# Patient Record
Sex: Male | Born: 1970 | Race: Black or African American | Hispanic: No | Marital: Married | Smoking: Light tobacco smoker
Health system: Southern US, Community
[De-identification: ages and names within clinical notes are randomized; demographics above are authoritative.]

## PROBLEM LIST (undated history)

## (undated) DIAGNOSIS — Z8709 Personal history of other diseases of the respiratory system: Secondary | ICD-10-CM

## (undated) DIAGNOSIS — Z973 Presence of spectacles and contact lenses: Secondary | ICD-10-CM

## (undated) DIAGNOSIS — I10 Essential (primary) hypertension: Secondary | ICD-10-CM

## (undated) DIAGNOSIS — E119 Type 2 diabetes mellitus without complications: Secondary | ICD-10-CM

## (undated) HISTORY — PX: NO PAST SURGERIES: SHX2092

---

## 1998-03-18 ENCOUNTER — Encounter: Admission: RE | Admit: 1998-03-18 | Discharge: 1998-03-18 | Payer: Self-pay | Admitting: *Deleted

## 1998-04-04 ENCOUNTER — Ambulatory Visit: Admission: RE | Admit: 1998-04-04 | Discharge: 1998-04-04 | Payer: Self-pay | Admitting: Internal Medicine

## 2016-06-08 ENCOUNTER — Ambulatory Visit: Payer: Self-pay | Admitting: General Surgery

## 2016-07-13 ENCOUNTER — Encounter (HOSPITAL_COMMUNITY): Payer: Self-pay

## 2016-07-13 ENCOUNTER — Encounter (HOSPITAL_COMMUNITY)
Admission: RE | Admit: 2016-07-13 | Discharge: 2016-07-13 | Disposition: A | Discharge status: Home / Self Care | Payer: BC Managed Care – PPO | Source: Ambulatory Visit | Attending: General Surgery | Admitting: General Surgery

## 2016-07-13 DIAGNOSIS — I10 Essential (primary) hypertension: Secondary | ICD-10-CM | POA: Diagnosis not present

## 2016-07-13 DIAGNOSIS — Z01810 Encounter for preprocedural cardiovascular examination: Secondary | ICD-10-CM | POA: Insufficient documentation

## 2016-07-13 DIAGNOSIS — K429 Umbilical hernia without obstruction or gangrene: Secondary | ICD-10-CM | POA: Insufficient documentation

## 2016-07-13 DIAGNOSIS — Z01818 Encounter for other preprocedural examination: Secondary | ICD-10-CM | POA: Diagnosis present

## 2016-07-13 DIAGNOSIS — Z01812 Encounter for preprocedural laboratory examination: Secondary | ICD-10-CM | POA: Insufficient documentation

## 2016-07-13 DIAGNOSIS — E119 Type 2 diabetes mellitus without complications: Secondary | ICD-10-CM | POA: Insufficient documentation

## 2016-07-13 DIAGNOSIS — Z8709 Personal history of other diseases of the respiratory system: Secondary | ICD-10-CM | POA: Insufficient documentation

## 2016-07-13 HISTORY — DX: Personal history of other diseases of the respiratory system: Z87.09

## 2016-07-13 HISTORY — DX: Essential (primary) hypertension: I10

## 2016-07-13 HISTORY — DX: Type 2 diabetes mellitus without complications: E11.9

## 2016-07-13 HISTORY — DX: Presence of spectacles and contact lenses: Z97.3

## 2016-07-13 LAB — BASIC METABOLIC PANEL
ANION GAP: 6 (ref 5–15)
BUN: 8 mg/dL (ref 6–20)
CALCIUM: 9.5 mg/dL (ref 8.9–10.3)
CO2: 26 mmol/L (ref 22–32)
CREATININE: 0.98 mg/dL (ref 0.61–1.24)
Chloride: 107 mmol/L (ref 101–111)
GFR calc Af Amer: >60 mL/min (ref >60)
GLUCOSE: 118 mg/dL — AB (ref 65–99)
Potassium: 3.7 mmol/L (ref 3.5–5.1)
Sodium: 139 mmol/L (ref 135–145)

## 2016-07-13 LAB — CBC WITH DIFFERENTIAL/PLATELET
BASOS ABS: 0 10*3/uL (ref 0.0–0.1)
BASOS PCT: 0 %
EOS ABS: 0.3 10*3/uL (ref 0.0–0.7)
EOS PCT: 3 %
HCT: 41.1 % (ref 39.0–52.0)
Hemoglobin: 14.5 g/dL (ref 13.0–17.0)
Lymphocytes Relative: 31 %
Lymphs Abs: 2.4 10*3/uL (ref 0.7–4.0)
MCH: 32.2 pg (ref 26.0–34.0)
MCHC: 35.3 g/dL (ref 30.0–36.0)
MCV: 91.1 fL (ref 78.0–100.0)
MONO ABS: 0.7 10*3/uL (ref 0.1–1.0)
MONOS PCT: 9 %
NEUTROS ABS: 4.5 10*3/uL (ref 1.7–7.7)
Neutrophils Relative %: 57 %
PLATELETS: 293 10*3/uL (ref 150–400)
RBC: 4.51 MIL/uL (ref 4.22–5.81)
RDW: 13.6 % (ref 11.5–15.5)
WBC: 7.8 10*3/uL (ref 4.0–10.5)

## 2016-07-13 LAB — GLUCOSE, CAPILLARY: GLUCOSE-CAPILLARY: 163 mg/dL — AB (ref 65–99)

## 2016-07-13 NOTE — Pre-Procedure Instructions (Signed)
David Strickland  07/13/2016     No Pharmacies Listed   Your procedure is scheduled on Tuesday, December 12th, 2017.  [facility] at 5:30 A.M.   Call this number if you have problems the morning of surgery:  [phone]   Remember:  Do not eat food or drink liquids after midnight.   Take these medicines the morning of surgery with A SIP OF WATER:   7 days prior to surgery, stop taking: Aspirin, NSAIDS, Aleve, Naproxen, Ibuprofen, Advil, Motrin, BC's, Goody's, Fish oil, all herbal medications, and all vitamins.    Do not wear jewelry.  Do not wear lotions, powders, or colognes, or deoderant.  Men may shave face and neck.  Do not bring valuables to the hospital.  [facility] any belongings or valuables.  Contacts, dentures or bridgework may not be worn into surgery.  Leave your suitcase in the car.  After surgery it may be brought to your room.  For patients admitted to the hospital, discharge time will be determined by your treatment team.  Patients discharged the day of surgery will not be allowed to drive home.   Special instructions:  Preparing for Surgery.   [place]- Preparing For Surgery  Before surgery, you can play an important role. Because skin is not sterile, your skin needs to be as free of germs as possible. You can reduce the number of germs on your skin by washing with CHG (chlorahexidine gluconate) Soap before surgery.  CHG is an antiseptic cleaner which kills germs and bonds with the skin to continue killing germs even after washing.  Please do not use if you have an allergy to CHG or antibacterial soaps. If your skin becomes reddened/irritated stop using the CHG.  Do not shave (including legs and underarms) for at least 48 hours prior to first CHG shower. It is OK to shave your face.  Please follow these instructions carefully.   1. Shower the NIGHT BEFORE SURGERY and the MORNING OF SURGERY with CHG.    2. If you chose to wash your hair, wash your hair first as usual with your normal shampoo.  3. After you shampoo, rinse your hair and body thoroughly to remove the shampoo.  4. Use CHG as you would any other liquid soap. You can apply CHG directly to the skin and wash gently with a scrungie or a clean washcloth.   5. Apply the CHG Soap to your body ONLY FROM THE NECK DOWN.  Do not use on open wounds or open sores. Avoid contact with your eyes, ears, mouth and genitals (private parts). Wash genitals (private parts) with your normal soap.  6. Wash thoroughly, paying special attention to the area where your surgery will be performed.  7. Thoroughly rinse your body with warm water from the neck down.  8. DO NOT shower/wash with your normal soap after using and rinsing off the CHG Soap.  9. Pat yourself dry with a CLEAN TOWEL.   10. Wear CLEAN PAJAMAS   11. Place CLEAN SHEETS on your bed the night of your first shower and DO NOT SLEEP WITH PETS.  Day of Surgery: Do not apply any deodorants/lotions. Please wear clean clothes to the hospital/surgery center.     Please read over the following fact sheets that you were given.

## 2016-07-13 NOTE — Progress Notes (Signed)
PCP - Dr. Knox RoyaltyEnrico Jones Cardiologist - denies  EKG - 07/13/16 CXR - 07/13/16  Echo/stress test/cardiac cath - denies  Patient denies chest pain and shortness of breath at PAT appointment.  Patient informed nurse that he does not check his blood sugar at home and that he recently had an A1C drawn at PCP.  Patient reports that it was 5.1.   Nurse requested A1C from PCP.

## 2016-07-15 NOTE — Progress Notes (Signed)
Spoke with David Strickland in MR at Dr.Erico Jones and states last A1C was in Feb 2017

## 2016-07-19 MED ORDER — ACETAMINOPHEN 500 MG PO TABS
1000.0000 mg | ORAL_TABLET | ORAL | Status: AC
  Administered 2016-07-20: 1000 mg via ORAL
  Filled 2016-07-19: qty 2

## 2016-07-19 MED ORDER — GABAPENTIN 300 MG PO CAPS
300.0000 mg | ORAL_CAPSULE | ORAL | Status: AC
  Administered 2016-07-20: 300 mg via ORAL
  Filled 2016-07-19: qty 1

## 2016-07-19 MED ORDER — CEFAZOLIN SODIUM-DEXTROSE 2-4 GM/100ML-% IV SOLN
2.0000 g | INTRAVENOUS | Status: AC
  Administered 2016-07-20: 2 g via INTRAVENOUS
  Filled 2016-07-19: qty 100

## 2016-07-20 ENCOUNTER — Encounter (HOSPITAL_COMMUNITY)
Admission: RE | Disposition: A | Discharge status: Home / Self Care | Payer: Self-pay | Source: Ambulatory Visit | Attending: General Surgery

## 2016-07-20 ENCOUNTER — Ambulatory Visit (HOSPITAL_COMMUNITY): Payer: BC Managed Care – PPO | Admitting: Anesthesiology

## 2016-07-20 ENCOUNTER — Encounter (HOSPITAL_COMMUNITY): Payer: Self-pay | Admitting: *Deleted

## 2016-07-20 ENCOUNTER — Ambulatory Visit (HOSPITAL_COMMUNITY)
Admission: RE | Admit: 2016-07-20 | Discharge: 2016-07-20 | Disposition: A | Discharge status: Home / Self Care | Payer: BC Managed Care – PPO | Source: Ambulatory Visit | Attending: General Surgery | Admitting: General Surgery

## 2016-07-20 DIAGNOSIS — I10 Essential (primary) hypertension: Secondary | ICD-10-CM | POA: Insufficient documentation

## 2016-07-20 DIAGNOSIS — E7800 Pure hypercholesterolemia, unspecified: Secondary | ICD-10-CM | POA: Diagnosis not present

## 2016-07-20 DIAGNOSIS — E119 Type 2 diabetes mellitus without complications: Secondary | ICD-10-CM | POA: Insufficient documentation

## 2016-07-20 DIAGNOSIS — K429 Umbilical hernia without obstruction or gangrene: Secondary | ICD-10-CM | POA: Diagnosis present

## 2016-07-20 DIAGNOSIS — Z6830 Body mass index (BMI) 30.0-30.9, adult: Secondary | ICD-10-CM | POA: Diagnosis not present

## 2016-07-20 DIAGNOSIS — F17200 Nicotine dependence, unspecified, uncomplicated: Secondary | ICD-10-CM | POA: Insufficient documentation

## 2016-07-20 DIAGNOSIS — K420 Umbilical hernia with obstruction, without gangrene: Secondary | ICD-10-CM | POA: Diagnosis not present

## 2016-07-20 DIAGNOSIS — E669 Obesity, unspecified: Secondary | ICD-10-CM | POA: Insufficient documentation

## 2016-07-20 DIAGNOSIS — Z79899 Other long term (current) drug therapy: Secondary | ICD-10-CM | POA: Diagnosis not present

## 2016-07-20 HISTORY — PX: UMBILICAL HERNIA REPAIR: SHX196

## 2016-07-20 HISTORY — PX: INSERTION OF MESH: SHX5868

## 2016-07-20 LAB — GLUCOSE, CAPILLARY
GLUCOSE-CAPILLARY: 91 mg/dL (ref 65–99)
GLUCOSE-CAPILLARY: 93 mg/dL (ref 65–99)

## 2016-07-20 SURGERY — REPAIR, HERNIA, UMBILICAL, LAPAROSCOPIC
Anesthesia: General

## 2016-07-20 MED ORDER — ONDANSETRON HCL 4 MG/2ML IJ SOLN
INTRAMUSCULAR | Status: DC | PRN
  Administered 2016-07-20: 4 mg via INTRAVENOUS

## 2016-07-20 MED ORDER — HYDROMORPHONE HCL 1 MG/ML IJ SOLN
0.2500 mg | INTRAMUSCULAR | Status: DC | PRN
  Administered 2016-07-20 (×3): 0.5 mg via INTRAVENOUS

## 2016-07-20 MED ORDER — LIDOCAINE 2% (20 MG/ML) 5 ML SYRINGE
INTRAMUSCULAR | Status: AC
  Filled 2016-07-20: qty 20

## 2016-07-20 MED ORDER — EPHEDRINE SULFATE 50 MG/ML IJ SOLN
INTRAMUSCULAR | Status: DC | PRN
  Administered 2016-07-20: 5 mg via INTRAVENOUS
  Administered 2016-07-20: 10 mg via INTRAVENOUS

## 2016-07-20 MED ORDER — CHLORHEXIDINE GLUCONATE CLOTH 2 % EX PADS: 6.0000 | MEDICATED_PAD | Freq: Once | CUTANEOUS | Status: DC

## 2016-07-20 MED ORDER — LIDOCAINE HCL (CARDIAC) 20 MG/ML IV SOLN
INTRAVENOUS | Status: DC | PRN
  Administered 2016-07-20: 60 mg via INTRAVENOUS

## 2016-07-20 MED ORDER — LACTATED RINGERS IV SOLN
INTRAVENOUS | Status: DC | PRN
  Administered 2016-07-20 (×2): via INTRAVENOUS

## 2016-07-20 MED ORDER — OXYCODONE-ACETAMINOPHEN 10-325 MG PO TABS: 1.0000 | ORAL_TABLET | ORAL | 0 refills | Status: AC | PRN

## 2016-07-20 MED ORDER — POLYMYXIN B SULFATE 500000 UNITS IJ SOLR
INTRAMUSCULAR | Status: DC | PRN
  Administered 2016-07-20: 500 mL

## 2016-07-20 MED ORDER — HYDROMORPHONE HCL 1 MG/ML IJ SOLN
INTRAMUSCULAR | Status: AC
  Filled 2016-07-20: qty 0.5

## 2016-07-20 MED ORDER — PHENYLEPHRINE 40 MCG/ML (10ML) SYRINGE FOR IV PUSH (FOR BLOOD PRESSURE SUPPORT)
PREFILLED_SYRINGE | INTRAVENOUS | Status: AC
  Filled 2016-07-20: qty 10

## 2016-07-20 MED ORDER — PHENYLEPHRINE HCL 10 MG/ML IJ SOLN
INTRAMUSCULAR | Status: DC | PRN
  Administered 2016-07-20: 120 ug via INTRAVENOUS
  Administered 2016-07-20: 80 ug via INTRAVENOUS
  Administered 2016-07-20: 120 ug via INTRAVENOUS

## 2016-07-20 MED ORDER — FENTANYL CITRATE (PF) 100 MCG/2ML IJ SOLN
INTRAMUSCULAR | Status: AC
  Filled 2016-07-20: qty 4

## 2016-07-20 MED ORDER — SUGAMMADEX SODIUM 200 MG/2ML IV SOLN
INTRAVENOUS | Status: DC | PRN
  Administered 2016-07-20: 400 mg via INTRAVENOUS

## 2016-07-20 MED ORDER — SUGAMMADEX SODIUM 200 MG/2ML IV SOLN
INTRAVENOUS | Status: AC
  Filled 2016-07-20: qty 4

## 2016-07-20 MED ORDER — PROPOFOL 10 MG/ML IV BOLUS
INTRAVENOUS | Status: DC | PRN
  Administered 2016-07-20: 180 mg via INTRAVENOUS
  Administered 2016-07-20: 20 mg via INTRAVENOUS

## 2016-07-20 MED ORDER — MIDAZOLAM HCL 2 MG/2ML IJ SOLN
INTRAMUSCULAR | Status: AC
  Filled 2016-07-20: qty 2

## 2016-07-20 MED ORDER — ROCURONIUM BROMIDE 50 MG/5ML IV SOSY
PREFILLED_SYRINGE | INTRAVENOUS | Status: AC
  Filled 2016-07-20: qty 15

## 2016-07-20 MED ORDER — FENTANYL CITRATE (PF) 100 MCG/2ML IJ SOLN
INTRAMUSCULAR | Status: AC
  Filled 2016-07-20: qty 2

## 2016-07-20 MED ORDER — SODIUM CHLORIDE 0.9 % IR SOLN
Status: DC | PRN
  Administered 2016-07-20: 1000 mL

## 2016-07-20 MED ORDER — PROMETHAZINE HCL 25 MG/ML IJ SOLN: 6.2500 mg | INTRAMUSCULAR | Status: DC | PRN

## 2016-07-20 MED ORDER — BUPIVACAINE-EPINEPHRINE 0.25% -1:200000 IJ SOLN
INTRAMUSCULAR | Status: DC | PRN
  Administered 2016-07-20: 30 mL

## 2016-07-20 MED ORDER — FENTANYL CITRATE (PF) 100 MCG/2ML IJ SOLN
INTRAMUSCULAR | Status: DC | PRN
  Administered 2016-07-20: 50 ug via INTRAVENOUS
  Administered 2016-07-20: 150 ug via INTRAVENOUS
  Administered 2016-07-20: 50 ug via INTRAVENOUS

## 2016-07-20 MED ORDER — PROPOFOL 10 MG/ML IV BOLUS
INTRAVENOUS | Status: AC
  Filled 2016-07-20: qty 20

## 2016-07-20 MED ORDER — ROCURONIUM BROMIDE 100 MG/10ML IV SOLN
INTRAVENOUS | Status: DC | PRN
  Administered 2016-07-20: 50 mg via INTRAVENOUS
  Administered 2016-07-20: 10 mg via INTRAVENOUS

## 2016-07-20 MED ORDER — PHENYLEPHRINE HCL 10 MG/ML IJ SOLN
INTRAMUSCULAR | Status: DC | PRN
  Administered 2016-07-20: 100 ug/min via INTRAVENOUS

## 2016-07-20 MED ORDER — OXYCODONE-ACETAMINOPHEN 5-325 MG PO TABS
ORAL_TABLET | ORAL | Status: AC
  Filled 2016-07-20: qty 1

## 2016-07-20 MED ORDER — EPHEDRINE 5 MG/ML INJ
INTRAVENOUS | Status: AC
  Filled 2016-07-20: qty 20

## 2016-07-20 MED ORDER — OXYCODONE-ACETAMINOPHEN 5-325 MG PO TABS
1.0000 | ORAL_TABLET | ORAL | Status: DC | PRN
  Administered 2016-07-20: 1 via ORAL

## 2016-07-20 MED ORDER — BUPIVACAINE-EPINEPHRINE (PF) 0.25% -1:200000 IJ SOLN
INTRAMUSCULAR | Status: AC
  Filled 2016-07-20: qty 30

## 2016-07-20 SURGICAL SUPPLY — 53 items
APPLIER CLIP LOGIC TI 5 (MISCELLANEOUS) IMPLANT
APPLIER CLIP ROT 10 11.4 M/L (STAPLE)
BINDER ABD UNIV 10 28-50 (GAUZE/BANDAGES/DRESSINGS) ×1 IMPLANT
BINDER ABDOM UNIV 10 (GAUZE/BANDAGES/DRESSINGS) ×3
CANISTER SUCTION 2500CC (MISCELLANEOUS) ×3 IMPLANT
CHLORAPREP W/TINT 26ML (MISCELLANEOUS) ×3 IMPLANT
CLIP APPLIE ROT 10 11.4 M/L (STAPLE) IMPLANT
COVER SURGICAL LIGHT HANDLE (MISCELLANEOUS) ×3 IMPLANT
DECANTER SPIKE VIAL GLASS SM (MISCELLANEOUS) ×3 IMPLANT
DERMABOND ADVANCED (GAUZE/BANDAGES/DRESSINGS) ×2
DERMABOND ADVANCED .7 DNX12 (GAUZE/BANDAGES/DRESSINGS) ×1 IMPLANT
DEVICE SECURE STRAP 25 ABSORB (INSTRUMENTS) ×6 IMPLANT
DEVICE TROCAR PUNCTURE CLOSURE (ENDOMECHANICALS) ×3 IMPLANT
DRAPE LAPAROSCOPIC ABDOMINAL (DRAPES) ×3 IMPLANT
ELECT REM PT RETURN 9FT ADLT (ELECTROSURGICAL) ×3
ELECTRODE REM PT RTRN 9FT ADLT (ELECTROSURGICAL) ×1 IMPLANT
GLOVE BIO SURGEON STRL SZ 6.5 (GLOVE) ×2 IMPLANT
GLOVE BIO SURGEON STRL SZ8 (GLOVE) ×3 IMPLANT
GLOVE BIO SURGEONS STRL SZ 6.5 (GLOVE) ×1
GLOVE BIOGEL PI IND STRL 6.5 (GLOVE) ×1 IMPLANT
GLOVE BIOGEL PI IND STRL 7.5 (GLOVE) ×1 IMPLANT
GLOVE BIOGEL PI IND STRL 8 (GLOVE) ×1 IMPLANT
GLOVE BIOGEL PI IND STRL 8.5 (GLOVE) ×1 IMPLANT
GLOVE BIOGEL PI INDICATOR 6.5 (GLOVE) ×2
GLOVE BIOGEL PI INDICATOR 7.5 (GLOVE) ×2
GLOVE BIOGEL PI INDICATOR 8 (GLOVE) ×2
GLOVE BIOGEL PI INDICATOR 8.5 (GLOVE) ×2
GLOVE ECLIPSE 7.5 STRL STRAW (GLOVE) ×6 IMPLANT
GOWN STRL REUS W/ TWL LRG LVL3 (GOWN DISPOSABLE) ×3 IMPLANT
GOWN STRL REUS W/TWL LRG LVL3 (GOWN DISPOSABLE) ×6
KIT BASIN OR (CUSTOM PROCEDURE TRAY) ×3 IMPLANT
KIT ROOM TURNOVER OR (KITS) ×3 IMPLANT
MARKER SKIN DUAL TIP RULER LAB (MISCELLANEOUS) ×3 IMPLANT
MESH VENTRALIGHT ST 6IN CRC (Mesh General) ×3 IMPLANT
NEEDLE SPNL 22GX3.5 QUINCKE BK (NEEDLE) ×3 IMPLANT
NS IRRIG 1000ML POUR BTL (IV SOLUTION) ×3 IMPLANT
PACK LAPAROSCOPY I 1258 (SET/KITS/TRAYS/PACK) ×3 IMPLANT
PAD ARMBOARD 7.5X6 YLW CONV (MISCELLANEOUS) ×6 IMPLANT
SCALPEL HARMONIC ACE (MISCELLANEOUS) ×3 IMPLANT
SCISSORS LAP 5X35 DISP (ENDOMECHANICALS) IMPLANT
SET IRRIG TUBING LAPAROSCOPIC (IRRIGATION / IRRIGATOR) IMPLANT
SLEEVE ENDOPATH XCEL 5M (ENDOMECHANICALS) ×3 IMPLANT
SUT MNCRL AB 4-0 PS2 18 (SUTURE) ×3 IMPLANT
SUT NOVA NAB GS-21 0 18 T12 DT (SUTURE) ×3 IMPLANT
TOWEL OR 17X24 6PK STRL BLUE (TOWEL DISPOSABLE) ×3 IMPLANT
TOWEL OR 17X26 10 PK STRL BLUE (TOWEL DISPOSABLE) ×3 IMPLANT
TRAY FOLEY CATH 16FRSI W/METER (SET/KITS/TRAYS/PACK) IMPLANT
TRAY LAPAROSCOPIC MC (CUSTOM PROCEDURE TRAY) IMPLANT
TROCAR XCEL BLUNT TIP 100MML (ENDOMECHANICALS) IMPLANT
TROCAR XCEL NON-BLD 11X100MML (ENDOMECHANICALS) ×3 IMPLANT
TROCAR XCEL NON-BLD 5MMX100MML (ENDOMECHANICALS) ×6 IMPLANT
TUBING INSUFFLATION (TUBING) ×3 IMPLANT
WATER STERILE IRR 1000ML POUR (IV SOLUTION) IMPLANT

## 2016-07-20 NOTE — Anesthesia Postprocedure Evaluation (Signed)
Anesthesia Post Note  Patient: David Strickland  Procedure(s) Performed: Procedure(s) (LRB): LAPAROSCOPIC UMBILICAL HERNIA REPAIR WITH MESH (N/A) INSERTION OF MESH (N/A)  Patient location during evaluation: PACU Anesthesia Type: General Level of consciousness: awake and alert, awake and oriented Pain management: pain level controlled Vital Signs Assessment: post-procedure vital signs reviewed and stable Respiratory status: spontaneous breathing, nonlabored ventilation, respiratory function stable and patient connected to nasal cannula oxygen Cardiovascular status: blood pressure returned to baseline and stable Postop Assessment: no signs of nausea or vomiting Anesthetic complications: no    Last Vitals:  Vitals:   07/20/16 1030 07/20/16 1100  BP:  (!) 141/96  Pulse:  74  Resp:  20  Temp: 36.5 C     Last Pain:  Vitals:   07/20/16 1100  PainSc: 0-No pain                 Thimothy Barretta,JAMES TERRILL

## 2016-07-20 NOTE — H&P (Signed)
Leeam C. Mitzie Naverett 06/08/2016 9:29 AM Location: [facility] Patient #: [id number] DOB: 11/24/1970 Married / Language: English / Race: Black or African American Male   History of Present Illness Marta Lamas(Amaya Blakeman O. Lindie SpruceWyatt MD; 06/08/2016 10:05 AM) Patient words: New-hernia.  The patient is a 45 year old male who presents with an umbilical hernia. No changes in management were made at the last visit. Symptoms include bulge at the umbilicus and abdominal pain (Also bloating). The pain is located in the upper abdomen and on the right side more than the left. The patient describes the pain as aching (bloatedness). Onset was gradual. The symptoms occur constantly. The episodes occur daily.   Other Problems Doristine Devoid(Chemira Jones, CMA; 06/08/2016 9:29 AM) Diabetes Mellitus  High blood pressure  Hypercholesterolemia   Past Surgical History Doristine Devoid(Chemira Jones, CMA; 06/08/2016 9:29 AM) No pertinent past surgical history   Diagnostic Studies History Doristine Devoid(Chemira Jones, CMA; 06/08/2016 9:29 AM) Colonoscopy  never  Allergies Doristine Devoid(Chemira Jones, CMA; 06/08/2016 9:30 AM) No Known Drug Allergies 06/08/2016  Medication History (Chemira Jones, CMA; 06/08/2016 9:30 AM) Lisinopril (20MG  Tablet, Oral) Active. Medications Reconciled  Social History Doristine Devoid(Chemira Jones, CMA; 06/08/2016 9:29 AM) Alcohol use  Occasional alcohol use. Caffeine use  Carbonated beverages. No drug use  Tobacco use  Current some day smoker.  Family History Doristine Devoid(Chemira Jones, CMA; 06/08/2016 9:29 AM) Alcohol Abuse  Father. Diabetes Mellitus  Mother. Heart Disease  Father. Hypertension  Father, Mother.    Review of Systems Doristine Devoid(Chemira Jones CMA; 06/08/2016 9:29 AM) General Not Present- Appetite Loss, Chills, Fatigue, Fever, Night Sweats, Weight Gain and Weight Loss. Skin Not Present- Change in Wart/Mole, Dryness, Hives, Jaundice, New Lesions, Non-Healing Wounds, Rash and Ulcer. HEENT Present- Wears glasses/contact lenses. Not Present-  Earache, Hearing Loss, Hoarseness, Nose Bleed, Oral Ulcers, Ringing in the Ears, Seasonal Allergies, Sinus Pain, Sore Throat, Visual Disturbances and Yellow Eyes. Respiratory Not Present- Bloody sputum, Chronic Cough, Difficulty Breathing, Snoring and Wheezing. Breast Not Present- Breast Mass, Breast Pain, Nipple Discharge and Skin Changes. Cardiovascular Not Present- Chest Pain, Difficulty Breathing Lying Down, Leg Cramps, Palpitations, Rapid Heart Rate, Shortness of Breath and Swelling of Extremities. Gastrointestinal Present- Abdominal Pain and Bloating. Not Present- Bloody Stool, Change in Bowel Habits, Chronic diarrhea, Constipation, Difficulty Swallowing, Excessive gas, Gets full quickly at meals, Hemorrhoids, Indigestion, Nausea, Rectal Pain and Vomiting. Male Genitourinary Not Present- Blood in Urine, Change in Urinary Stream, Frequency, Impotence, Nocturia, Painful Urination, Urgency and Urine Leakage. Musculoskeletal Not Present- Back Pain, Joint Pain, Joint Stiffness, Muscle Pain, Muscle Weakness and Swelling of Extremities. Neurological Not Present- Decreased Memory, Fainting, Headaches, Numbness, Seizures, Tingling, Tremor, Trouble walking and Weakness. Psychiatric Not Present- Anxiety, Bipolar, Change in Sleep Pattern, Depression, Fearful and Frequent crying. Endocrine Not Present- Cold Intolerance, Excessive Hunger, Hair Changes, Heat Intolerance, Hot flashes and New Diabetes. Hematology Not Present- Blood Thinners, Easy Bruising, Excessive bleeding, Gland problems, HIV and Persistent Infections.  Vitals (Chemira Jones CMA; 06/08/2016 9:30 AM) 06/08/2016 9:30 AM Weight: 229 lb Height: 71in Body Surface Area: 2.23 m Body Mass Index: 31.94 kg/m  Temp.: 99.50F(Oral)  Pulse: 73 (Regular)  BP: 154/94 (Sitting, Left Arm, Standard) Low grade fever of 99.7.  Asymptomatic BP slightly elevated 158/102   Physical Exam (Khalik Pewitt O. Lindie SpruceWyatt MD; 06/08/2016 10:09 AM) General Mental  Status-Alert. General Appearance-Anxious, Cooperative and Well groomed. Orientation-Oriented X4. Build & Nutrition-Obese(Has lost a significant amount of weight in the last year intentionally).  Chest and Lung Exam Chest and lung exam reveals -normal excursion with symmetric chest  walls and on auscultation, normal breath sounds, no adventitious sounds and normal vocal resonance.  Cardiovascular Cardiovascular examination reveals -no digital clubbing, cyanosis, edema, increased warmth or tenderness. Auscultation Murmurs & Other Heart Sounds - Auscultation of the heart reveals - Note: No murmurs.  Abdomen Inspection Hernias - Umbilical hernia - Reducible(Mildly tender, but reducible).  Did not attempt to reduce today. No abdominal pain.  Assessment & Plan Fayrene Fearing(Rickayla Wieland O. Yovan Leeman MD; 06/08/2016 10:12 AM) UMBILICAL HERNIA WITHOUT OBSTRUCTION AND WITHOUT GANGRENE (K42.9) Story: Has had this for 10+ years Impression: More symptomatic recently, awakens with symptoms of aching and pain. Never incarcerated hernia. Want repair. Have offered the patient laparoscopic repair, but either is good with him. We will call him to schedule. Current Plans:  L:aparoscopic umbilical hernia repair with mesh Possible open Preoperative antibiotics. Marta LamasJames O. Gae BonWyatt, III, MD, FACS [phone]--pager [phone]--office [facility]

## 2016-07-20 NOTE — Anesthesia Procedure Notes (Signed)
Procedure Name: Intubation Date/Time: 07/20/2016 7:36 AM Performed by: Carmela RimaMARTINELLI, Kaisey Huseby F Pre-anesthesia Checklist: Timeout performed, Patient being monitored, Suction available, Emergency Drugs available and Patient identified Patient Re-evaluated:Patient Re-evaluated prior to inductionOxygen Delivery Method: Circle system utilized Preoxygenation: Pre-oxygenation with 100% oxygen Intubation Type: IV induction Ventilation: Mask ventilation without difficulty and Oral airway inserted - appropriate to patient size Laryngoscope Size: Mac and 4 Grade View: Grade I Tube type: Oral Tube size: 7.5 mm Number of attempts: 1 Placement Confirmation: breath sounds checked- equal and bilateral,  positive ETCO2 and ETT inserted through vocal cords under direct vision Secured at: 23 cm Tube secured with: Tape Dental Injury: Teeth and Oropharynx as per pre-operative assessment

## 2016-07-20 NOTE — Transfer of Care (Signed)
Immediate Anesthesia Transfer of Care Note  Patient: Haynes Hoehnhomas C [place]brooks  Procedure(s) Performed: Procedure(s): LAPAROSCOPIC UMBILICAL HERNIA REPAIR WITH MESH (N/A) INSERTION OF MESH (N/A)  Patient Location: PACU  Anesthesia Type:General  Level of Consciousness: awake, alert  and oriented  Airway & Oxygen Therapy: Patient Spontanous Breathing and Patient connected to nasal cannula oxygen  Post-op Assessment: Report given to RN, Post -op Vital signs reviewed and stable and Patient moving all extremities X 4  Post vital signs: Reviewed and stable  Last Vitals:  Vitals:   07/20/16 0616 07/20/16 0626  BP: (!) 158/102   Pulse: 93   Resp: 16   Temp:  37.6 C    Last Pain: There were no vitals filed for this visit.       Complications: No apparent anesthesia complications

## 2016-07-20 NOTE — Anesthesia Preprocedure Evaluation (Addendum)
Anesthesia Evaluation  Patient identified by MRN, date of birth, ID band  Reviewed: Allergy & Precautions, H&P , NPO status   Airway Mallampati: II  TM Distance: >3 FB Neck ROM: full    Dental  (+) Teeth Intact, Dental Advidsory Given,    Pulmonary Current Smoker,    breath sounds clear to auscultation       Cardiovascular hypertension,  Rhythm:Regular Rate:Normal     Neuro/Psych    GI/Hepatic Neg liver ROS,   Endo/Other  diabetes, Type 2  Renal/GU negative Renal ROS     Musculoskeletal   Abdominal   Peds  Hematology   Anesthesia Other Findings   Reproductive/Obstetrics                         Anesthesia Physical Anesthesia Plan  ASA: III  Anesthesia Plan: General   Post-op Pain Management:    Induction: Intravenous  Airway Management Planned: Oral ETT  Additional Equipment:   Intra-op Plan:   Post-operative Plan:   Informed Consent: I have reviewed the patients History and Physical, chart, labs and discussed the procedure including the risks, benefits and alternatives for the proposed anesthesia with the patient or authorized representative who has indicated his/her understanding and acceptance.   Dental Advisory Given  Plan Discussed with:   Anesthesia Plan Comments:        Anesthesia Quick Evaluation

## 2016-07-20 NOTE — Op Note (Signed)
OPERATIVE REPORT  DATE OF OPERATION: 07/20/2016  PATIENT:  David Strickland  45 y.o. male  PRE-OPERATIVE DIAGNOSIS:  Symptomatic umbilical hernia  POST-OPERATIVE DIAGNOSIS:  Symptomatic umbilical hernia  INDICATION(S) FOR OPERATION:  Incarcerated, but not obstructed or strangulated, ventral/umbilical hernia  FINDINGS:  2 cm defect with incarcerated omentum  PROCEDURE:  Procedure(s): LAPAROSCOPIC UMBILICAL HERNIA REPAIR WITH MESH INSERTION OF MESH  SURGEON:  Surgeon(s): Jimmye NormanJames Lui Bellis, MD  ASSISTANT: None  ANESTHESIA:   general  COMPLICATIONS:  None  EBL: <20 ml  BLOOD ADMINISTERED: none  DRAINS: none   SPECIMEN:  No Specimen  COUNTS CORRECT:  YES  PROCEDURE DETAILS: The patient was taken to the operating room and placed on the table in the supine position. After an adequate general endotracheal anesthetic was administered he was prepped and draped in usual sterile manner exposing his abdomen.  A proper timeout was performed identifying the patient and procedure to be performed.  We started the procedure with an Optiview 11 mm cannula and trocar in the left upper quadrant with safety when into the peritoneal cavity. Once it was identified to be in the peritoneal cavity we insufflated carbon dioxide gas up to a maximal intra-abdominal pressure of 15 mmHg.  Subsequently 3 other 5 mm cannula was passed into the peritoneal cavity under direct vision one in the left lower quadrant one in the right lower quadrant and one in the right mid abdominal area.  The incarcerated omentum could be seen going up into the hernia defect. Using a Harmonic Scalpel and blunt dissection we were able to retrieve incarcerated omentum completely from the hernia defect. Parts of it were taken off her removed from the patient's peritoneal cavity but not sent as specimen.  We subsequently marked the edges of the hernia defect with a spinal needle and a marking pen. We measured 5 cm from the margins of  the hernia defect and subsequently acquired a 15 cm circular piece of mesh for the repair.  A 15.2 cm circular VentraLite coated mesh was used for the repair. 0 Novafil sutures were placed on the abdominal wall side of the mesh in 4 equally space areas along the margins of the mesh. Subsequently passed into the peritoneal cavity. Using a suture retrieval device we pulled the sutures back up to the abdominal wall securing the mesh to the abdominal wall with the sutures that have been placed in 4 quadrants. We subsequently used secure strap stapling device to secure the mesh along the abdominal wall. Once this was done we allowed the carbon dioxide gas to escape and the mesh remained in place. All counts were correct. All needle counts, sponge counts, and instrument counts were correct. The wounds were closed after injecting 0.25% Marcaine with epinephrine into the wounds. Dermabond Steri-Strips and Tegaderms views complete the dressing Monocryl was used to close the 11 mm skin site.   PATIENT DISPOSITION:  PACU - hemodynamically stable.   Trinika Cortese 12/12/20179:14 AM

## 2016-07-20 NOTE — Discharge Instructions (Addendum)
Laparoscopic Ventral Hernia Repair, Care After This sheet gives you information about how to care for yourself after your procedure. Your health care provider may also give you more specific instructions. If you have problems or questions, contact your health care provider. What can I expect after the procedure? After the procedure, it is common to have:  Pain, discomfort, or soreness. Follow these instructions at home: Incision care  Follow instructions from your health care provider about how to take care of your incision. Make sure you:  Wash your hands with soap and water before you change your bandage (dressing) or before you touch your abdomen. If soap and water are not available, use hand sanitizer.  Change your dressing as told by your health care provider.  Leave intact until seen in clinic  Wear abdominal binder at all times  Leave stitches (sutures), skin glue, or adhesive strips in place. These skin closures may need to stay in place for 2 weeks or longer. If adhesive strip edges start to loosen and curl up, you may trim the loose edges. Do not remove adhesive strips completely unless your health care provider tells you to do that.  Check your incision area every day for signs of infection. Check for:  Redness, swelling, or pain.  Fluid or blood.  Warmth.  Pus or a bad smell. Bathing  Do not take baths, swim, or use a hot tub until your health care provider approves. Ask your health care provider if you can take showers. You may only be allowed to take sponge baths for bathing.  Keep your bandage (dressing) dry until your health care provider says it can be removed. Activity  Do not lift anything that is heavier than 10 lb (4.5 kg) until your health care provider approves.  Do not drive or use heavy machinery while taking prescription pain medicine. Ask your health care provider when it is safe for you to drive or use heavy machinery.  Do not drive for 24 hours if  you were given a medicine to help you relax (sedative) during your procedure.  Rest as told by your health care provider. You may return to your normal activities when your health care provider approves. General instructions  Take over-the-counter and prescription medicines only as told by your health care provider.  To prevent or treat constipation while you are taking prescription pain medicine, your health care provider may recommend that you:  Take over-the-counter or prescription medicines.  Eat foods that are high in fiber, such as fresh fruits and vegetables, whole grains, and beans.  Limit foods that are high in fat and processed sugars, such as fried and sweet foods.  Drink enough fluid to keep your urine clear or pale yellow.  Hold a pillow over your abdomen when you cough or sneeze. This helps with pain.  Keep all follow-up visits as told by your health care provider. This is important. Contact a health care provider if:  You have:  A fever or chills.  Redness, swelling, or pain around your incision.  Fluid or blood coming from your incision.  Pus or a bad smell coming from your incision.  Pain that gets worse or does not get better with medicine.  Nausea or vomiting.  A cough.  Shortness of breath.  Your incision feels warm to the touch.  You have not had a bowel movement in three days.  You are not able to urinate. Get help right away if:  You have severe pain in  your abdomen.  You have persistent nausea and vomiting.  You have redness, warmth, or pain in your leg.  You have chest pain.  You have trouble breathing. Summary  After this procedure, it is common to have pain, discomfort, or soreness.  Follow instructions from your health care provider about how to take care of your incision.  Check your incision area every day for signs of infection. Report any signs of infection to your health care provider.  Keep all follow-up visits as told  by your health care provider. This is important. This information is not intended to replace advice given to you by your health care provider. Make sure you discuss any questions you have with your health care provider. Document Released: 07/12/2012 Document Revised: 03/17/2016 Document Reviewed: 03/17/2016 Elsevier Interactive Patient Education  2017 [employer]

## 2016-07-21 ENCOUNTER — Encounter (HOSPITAL_COMMUNITY): Payer: Self-pay | Admitting: General Surgery

## 2016-07-21 LAB — HEMOGLOBIN A1C
HEMOGLOBIN A1C: 5.4 % (ref 4.8–5.6)
Mean Plasma Glucose: 108 mg/dL

## 2016-07-25 ENCOUNTER — Emergency Department (HOSPITAL_COMMUNITY)
Admission: EM | Admit: 2016-07-25 | Discharge: 2016-07-25 | Disposition: A | Discharge status: Home / Self Care | Payer: BC Managed Care – PPO | Attending: Emergency Medicine | Admitting: Emergency Medicine

## 2016-07-25 ENCOUNTER — Encounter (HOSPITAL_COMMUNITY): Payer: Self-pay | Admitting: *Deleted

## 2016-07-25 DIAGNOSIS — I10 Essential (primary) hypertension: Secondary | ICD-10-CM | POA: Diagnosis not present

## 2016-07-25 DIAGNOSIS — E119 Type 2 diabetes mellitus without complications: Secondary | ICD-10-CM | POA: Insufficient documentation

## 2016-07-25 DIAGNOSIS — T7840XA Allergy, unspecified, initial encounter: Secondary | ICD-10-CM | POA: Diagnosis present

## 2016-07-25 DIAGNOSIS — F17290 Nicotine dependence, other tobacco product, uncomplicated: Secondary | ICD-10-CM | POA: Diagnosis not present

## 2016-07-25 DIAGNOSIS — T783XXA Angioneurotic edema, initial encounter: Secondary | ICD-10-CM | POA: Insufficient documentation

## 2016-07-25 MED ORDER — DIPHENHYDRAMINE HCL 50 MG/ML IJ SOLN
25.0000 mg | Freq: Once | INTRAMUSCULAR | Status: AC
  Administered 2016-07-25: 25 mg via INTRAVENOUS
  Filled 2016-07-25: qty 1

## 2016-07-25 MED ORDER — FAMOTIDINE IN NACL 20-0.9 MG/50ML-% IV SOLN
20.0000 mg | Freq: Once | INTRAVENOUS | Status: AC
  Administered 2016-07-25: 20 mg via INTRAVENOUS
  Filled 2016-07-25: qty 50

## 2016-07-25 MED ORDER — METHYLPREDNISOLONE SODIUM SUCC 125 MG IJ SOLR
125.0000 mg | Freq: Once | INTRAMUSCULAR | Status: AC
  Administered 2016-07-25: 125 mg via INTRAVENOUS
  Filled 2016-07-25: qty 2

## 2016-07-25 MED ORDER — SODIUM CHLORIDE 0.9 % IV BOLUS (SEPSIS)
1000.0000 mL | Freq: Once | INTRAVENOUS | Status: AC
  Administered 2016-07-25: 1000 mL via INTRAVENOUS

## 2016-07-25 NOTE — ED Provider Notes (Signed)
Patient seen and evaluated. Care discussed with Dr. Bebe ShaggyWickline. Care assumed at 07:00. Patient states he feels his lip is softer. It is now 9:30 AM. Pharynx normal tongue normal. He is discharged home. Stop lisinopril. Return immediately with worsening. Contact his physician to discuss new blood pressure medication prescription.   David PorterMark Chidi Shirer, MD 07/25/16 [phone]

## 2016-07-25 NOTE — ED Notes (Signed)
He continues to feel well and he is breathing normally. He also continues to deny any sensation of tongue or throat swelling. He ambulates to b.r. And back without difficulty.

## 2016-07-25 NOTE — ED Triage Notes (Signed)
Patient is alert and oriented x4.  He is complaining of oral swelling without airway compromise.  Patient denies any allergies or any history of this issue.  Patient does admit to taking lisinopril.

## 2016-07-25 NOTE — ED Notes (Signed)
Patient denies pain and is resting comfortably.  

## 2016-07-25 NOTE — ED Provider Notes (Signed)
[facility]-EMERGENCY DEPT Provider Note   CSN: [id number] Arrival date & time: 07/25/16  0518     History   Chief Complaint Chief Complaint  Patient presents with  . Allergic Reaction    HPI David Strickland is a 45 y.o. male.  The history is provided by the patient and a significant other.  Allergic Reaction  Presenting symptoms: swelling   Presenting symptoms: no difficulty breathing, no difficulty swallowing, no itching and no rash   Severity:  Moderate Duration:  12 hours Prior allergic episodes:  No prior episodes Context: medications   Context comment:  Recent increase in lisinopril Relieved by:  Nothing Worsened by:  Nothing Pt reports onset of upper lip swelling No tongue swelling No sob No difficulty swallowing He reports recent increase in lisinopril dosage He had recent umbilical hernia repair and has been taking percocet but no issues with that medicine He has never had this before  Past Medical History:  Diagnosis Date  . Diabetes mellitus without complication (HCC)   . History of bronchitis   . Hypertension   . Wears glasses     There are no active problems to display for this patient.   Past Surgical History:  Procedure Laterality Date  . INSERTION OF MESH N/A 07/20/2016   Procedure: INSERTION OF MESH;  Surgeon: Jimmye NormanJames Wyatt, MD;  [facility];  Service: General;  Laterality: N/A;  . NO PAST SURGERIES    . UMBILICAL HERNIA REPAIR N/A 07/20/2016   Procedure: LAPAROSCOPIC UMBILICAL HERNIA REPAIR WITH MESH;  Surgeon: Jimmye NormanJames Wyatt, MD;  Location: [facility] OR;  Service: General;  Laterality: N/A;       Home Medications    Prior to Admission medications   Medication Sig Start Date End Date Taking? Authorizing Provider  acetaminophen (TYLENOL) 500 MG tablet Take 500 mg by mouth every 6 (six) hours as needed for headache (or pain).    Yes Historical Provider, MD  docusate sodium (COLACE) 100 MG capsule Take 100 mg by mouth daily as needed for mild  constipation.   Yes Historical Provider, MD  Multiple Vitamins-Minerals (ONE-A-DAY MENS HEALTH FORMULA PO) Take 1 tablet by mouth daily.   Yes Historical Provider, MD  oxyCODONE-acetaminophen (PERCOCET) 10-325 MG tablet Take 1 tablet by mouth every 4 (four) hours as needed for pain. 07/20/16  Yes Jimmye NormanJames Wyatt, MD    Family History No family history on file.  Social History Social History  Substance Use Topics  . Smoking status: Light Tobacco Smoker    Types: Cigars  . Smokeless tobacco: Never Used     Comment: 1 cigar every other day  . Alcohol use No     Allergies   No known allergies   Review of Systems Review of Systems  Constitutional: Negative for fever.  HENT: Negative for drooling and trouble swallowing.   Respiratory: Negative for shortness of breath.   Gastrointestinal: Negative for vomiting.  Skin: Negative for itching and rash.  All other systems reviewed and are negative.    Physical Exam Updated Vital Signs BP 136/81 (BP Location: Left Arm)   Pulse 62   Temp 98.5 F (36.9 C) (Oral)   Resp 14   Ht 5\' 11"  (1.803 m)   Wt 100.2 kg   SpO2 96%   BMI 30.82 kg/m   Physical Exam CONSTITUTIONAL: Well developed/well nourished HEAD: Normocephalic/atraumatic EYES: EOMI/PERRL ENMT: Mucous membranes moist, significant edema to upper lip, no edema to lower lip, no tongue edema, uvula midline, voice normal, no drooling,  no stridor noted NECK: supple no meningeal signs SPINE/BACK:entire spine nontender CV: S1/S2 noted, no murmurs/rubs/gallops noted LUNGS: Lungs are clear to auscultation bilaterally, no apparent distress ABDOMEN: soft, nontender NEURO: Pt is awake/alert/appropriate, moves all extremitiesx4.  No facial droop.   EXTREMITIES: pulses normal/equal, full ROM SKIN: warm, color normal, no rash PSYCH: no abnormalities of mood noted, alert and oriented to situation   ED Treatments / Results  Labs (all labs ordered are listed, but only abnormal results  are displayed) Labs Reviewed - No data to display  EKG  EKG Interpretation None       Radiology No results found.  Procedures Procedures (including critical care time)  Medications Ordered in ED Medications  diphenhydrAMINE (BENADRYL) injection 25 mg (25 mg Intravenous Given 07/25/16 0538)  famotidine (PEPCID) IVPB 20 mg premix (0 mg Intravenous Stopped 07/25/16 16100608)  sodium chloride 0.9 % bolus 1,000 mL (1,000 mLs Intravenous New Bag/Given 07/25/16 0538)  methylPREDNISolone sodium succinate (SOLU-MEDROL) 125 mg/2 mL injection 125 mg (125 mg Intravenous Given 07/25/16 0538)     Initial Impression / Assessment and Plan / ED Course  I have reviewed the triage vital signs and the nursing notes.  Pertinent labs & imaging results that were available during my care of the patient were reviewed by me and considered in my medical decision making (see chart for details).  Clinical Course     6:29 AM Pt will need to STOP lisinopril Will watch in ED for 3 hrs Pt agreeable 7:15 AM At signout to dr Fayrene Fearingjames, monitor until 9am If no worsening angioedema he may be discharged He will need to STOP lisionpril and call PCP tomorrow for new medications  Final Clinical Impressions(s) / ED Diagnoses   Final diagnoses:  Angioedema of lips, initial encounter    New Prescriptions Current Discharge Medication List       Zadie Rhineonald Corri Delapaz, MD 07/25/16 [phone]

## 2016-07-25 NOTE — Discharge Instructions (Signed)
Stop taking lisinopril.  Call your physician after the swelling has resolved to discuss new blood pressure medication.

## 2016-07-25 NOTE — ED Notes (Signed)
He is awake, alert and is breathing normally. Edema of upper lip is observed, but he tells me "it is better than it was--it is smaller than it was."  He denies any tongue or throat swelling.

## 2018-04-16 IMAGING — CR DG CHEST 2V
2 series · 2 of 2 positions shown · non-contrast
Comparison: None.

CLINICAL DATA: Preop hernia surgery.

EXAM:
CHEST  2 VIEW

[w chest pa]
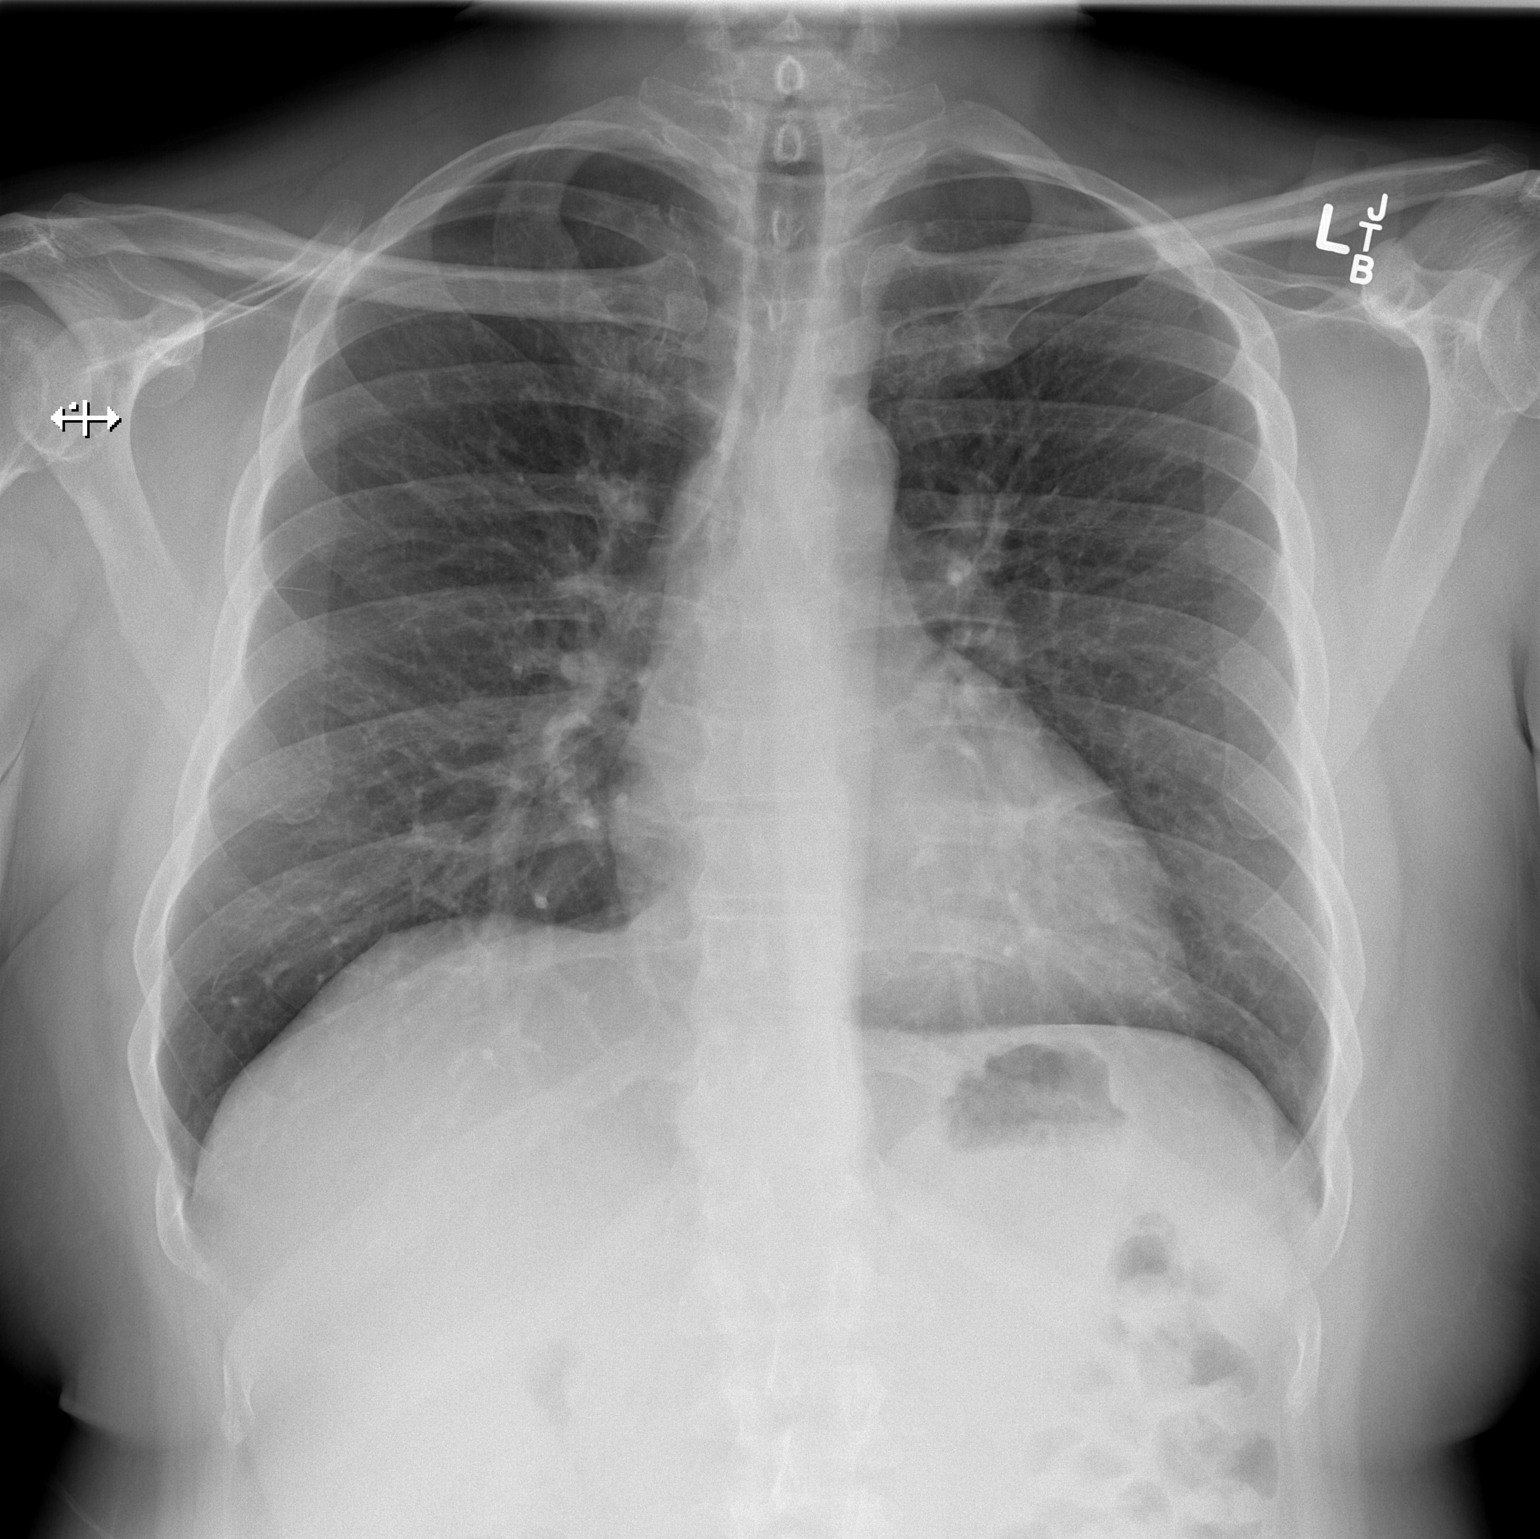

[w chest lat]
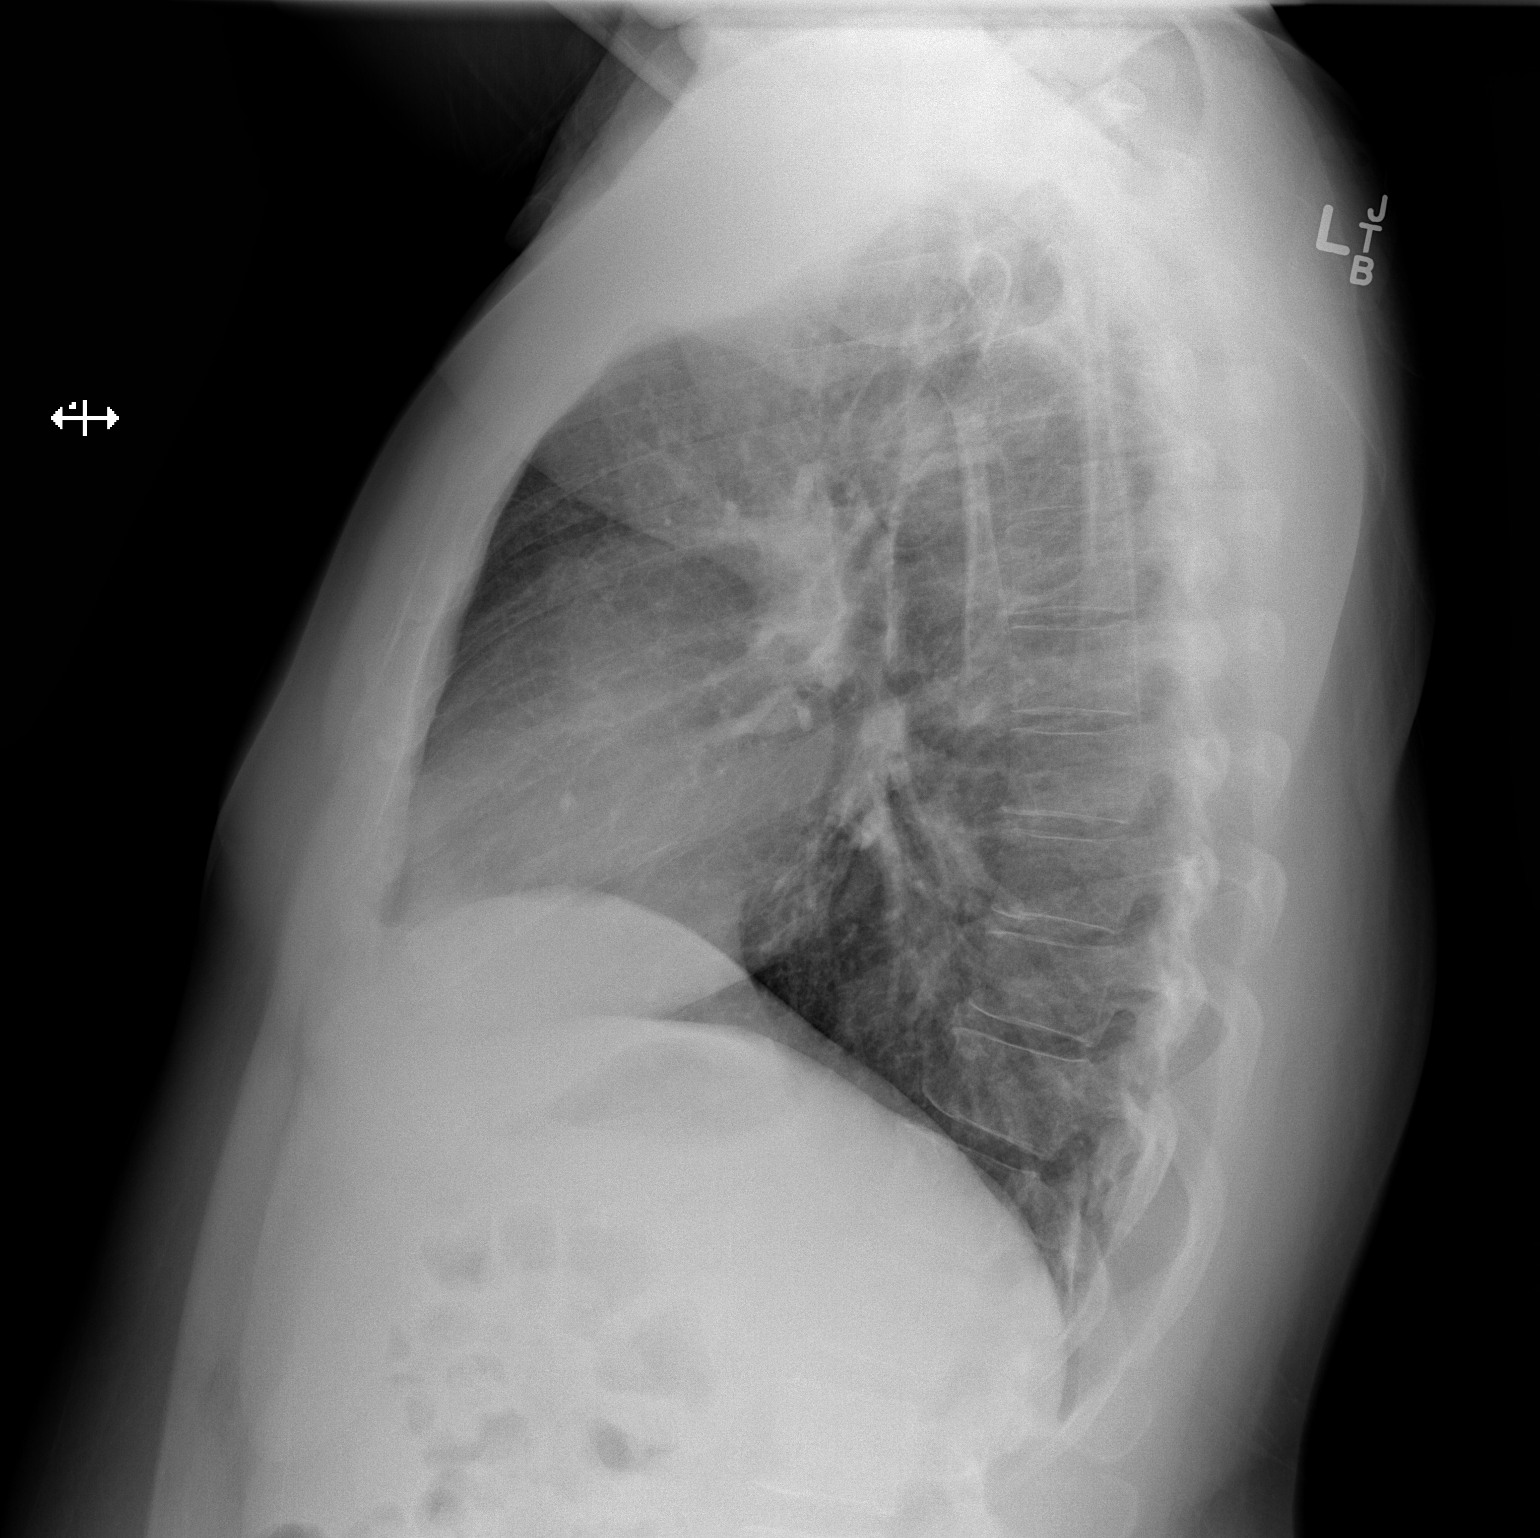

[2 of 2 positions shown; findings below may reference images not displayed]

FINDINGS: The heart size and mediastinal contours are within normal limits.
Both lungs are clear. The visualized skeletal structures are
unremarkable.
IMPRESSION: No active cardiopulmonary disease.

## 2019-08-20 ENCOUNTER — Ambulatory Visit: Payer: BC Managed Care – PPO | Attending: Internal Medicine

## 2019-08-20 DIAGNOSIS — Z20822 Contact with and (suspected) exposure to covid-19: Secondary | ICD-10-CM

## 2019-08-22 LAB — NOVEL CORONAVIRUS, NAA: SARS-CoV-2, NAA: DETECTED — AB

## 2022-01-13 ENCOUNTER — Encounter (HOSPITAL_COMMUNITY): Payer: Self-pay

## 2022-01-13 ENCOUNTER — Other Ambulatory Visit: Payer: Self-pay

## 2022-01-13 ENCOUNTER — Emergency Department (HOSPITAL_COMMUNITY)
Admission: EM | Admit: 2022-01-13 | Discharge: 2022-01-13 | Disposition: A | Discharge status: Home / Self Care | Payer: BC Managed Care – PPO | Attending: Emergency Medicine | Admitting: Emergency Medicine

## 2022-01-13 ENCOUNTER — Emergency Department (HOSPITAL_COMMUNITY): Payer: BC Managed Care – PPO

## 2022-01-13 DIAGNOSIS — I10 Essential (primary) hypertension: Secondary | ICD-10-CM | POA: Diagnosis not present

## 2022-01-13 DIAGNOSIS — R944 Abnormal results of kidney function studies: Secondary | ICD-10-CM | POA: Insufficient documentation

## 2022-01-13 LAB — BASIC METABOLIC PANEL
Anion gap: 9 (ref 5–15)
BUN: 15 mg/dL (ref 6–20)
CO2: 26 mmol/L (ref 22–32)
Calcium: 9.3 mg/dL (ref 8.9–10.3)
Chloride: 106 mmol/L (ref 98–111)
Creatinine, Ser: 1.36 mg/dL — ABNORMAL HIGH (ref 0.61–1.24)
GFR, Estimated: >60 mL/min (ref >60)
Glucose, Bld: 97 mg/dL (ref 70–99)
Potassium: 3.6 mmol/L (ref 3.5–5.1)
Sodium: 141 mmol/L (ref 135–145)

## 2022-01-13 LAB — CBC WITH DIFFERENTIAL/PLATELET
Abs Immature Granulocytes: 0.05 10*3/uL (ref 0.00–0.07)
Basophils Absolute: 0.1 10*3/uL (ref 0.0–0.1)
Basophils Relative: 1 %
Eosinophils Absolute: 0.4 10*3/uL (ref 0.0–0.5)
Eosinophils Relative: 3 %
HCT: 37.4 % — ABNORMAL LOW (ref 39.0–52.0)
Hemoglobin: 12.7 g/dL — ABNORMAL LOW (ref 13.0–17.0)
Immature Granulocytes: 0 %
Lymphocytes Relative: 15 %
Lymphs Abs: 1.9 10*3/uL (ref 0.7–4.0)
MCH: 32.4 pg (ref 26.0–34.0)
MCHC: 34 g/dL (ref 30.0–36.0)
MCV: 95.4 fL (ref 80.0–100.0)
Monocytes Absolute: 1.3 10*3/uL — ABNORMAL HIGH (ref 0.1–1.0)
Monocytes Relative: 10 %
Neutro Abs: 8.9 10*3/uL — ABNORMAL HIGH (ref 1.7–7.7)
Neutrophils Relative %: 71 %
Platelets: 349 10*3/uL (ref 150–400)
RBC: 3.92 MIL/uL — ABNORMAL LOW (ref 4.22–5.81)
RDW: 13.8 % (ref 11.5–15.5)
WBC: 12.6 10*3/uL — ABNORMAL HIGH (ref 4.0–10.5)
nRBC: 0 % (ref 0.0–0.2)

## 2022-01-13 MED ORDER — LABETALOL HCL 100 MG PO TABS: 100.0000 mg | ORAL_TABLET | Freq: Two times a day (BID) | ORAL | 0 refills | Status: AC

## 2022-01-13 MED ORDER — LABETALOL HCL 5 MG/ML IV SOLN
20.0000 mg | Freq: Once | INTRAVENOUS | Status: AC
  Administered 2022-01-13: 20 mg via INTRAVENOUS
  Filled 2022-01-13: qty 4

## 2022-01-13 NOTE — ED Notes (Signed)
Pt complaining of pain in the lower abdomen from diverticulitis, has a small headache to the left temporal area of head.

## 2022-01-13 NOTE — ED Provider Triage Note (Signed)
Emergency Medicine Provider Triage Evaluation Note  David Strickland , a 51 y.o. male  was evaluated in triage.  Pt complains of hypertension.  He states that he originally went to his primary care doctor for management of 'diverticulitis pain' and was found to have a blood pressure above 200 and was sent here for further evaluation.  He states that he used to be morbidly obese and was on blood pressure medication, however he successfully lost a substantial amount of weight and was able to come off his blood pressure medication in 2017.  He states that he has intermittently checked his blood pressure since then and it is always been normal.  He denies any chest pain or shortness of breath.  Review of Systems  Positive:  Negative: See above  Physical Exam  BP (!) 215/138 (BP Location: Right Arm)   Pulse (!) 104   Temp 98.1 F (36.7 C) (Oral)   Resp 18   SpO2 98%  Gen:   Awake, no distress   Resp:  Normal effort MSK:   Moves extremities without difficulty  Other:    Medical Decision Making  Medically screening exam initiated at 3:00 PM.  Appropriate orders placed.  David Strickland was informed that the remainder of the evaluation will be completed by another provider, this initial triage assessment does not replace that evaluation, and the importance of remaining in the ED until their evaluation is complete.     Silva Bandy, PA-C 01/13/22 1502

## 2022-01-13 NOTE — ED Provider Notes (Signed)
[facility] DE[facility] Provider Note   CSN: [id number] Arrival date & time: 01/13/22  1430     History  Chief Complaint  Patient presents with   Hypertension    David Strickland is a 51 y.o. male.  Patient had a history of hypertension but has not been on his medicine for a while.  He went to his family doctor today just for checkup and and his blood pressure was severely elevated so he was sent to the emergency to department for evaluation patient has no complaints now  The history is provided by the patient and medical records. No language interpreter was used.  Hypertension This is a recurrent problem. The current episode started more than 2 days ago. The problem occurs constantly. The problem has not changed since onset.Pertinent negatives include no chest pain, no abdominal pain and no headaches. Nothing aggravates the symptoms. Nothing relieves the symptoms.      Home Medications Prior to Admission medications   Medication Sig Start Date End Date Taking? Authorizing Provider  labetalol (NORMODYNE) 100 MG tablet Take 1 tablet (100 mg total) by mouth 2 (two) times daily. 01/13/22  Yes Bethann Berkshire, MD  acetaminophen (TYLENOL) 500 MG tablet Take 500 mg by mouth every 6 (six) hours as needed for headache (or pain).     [provider]  docusate sodium (COLACE) 100 MG capsule Take 100 mg by mouth daily as needed for mild constipation.    [provider]  Multiple Vitamins-Minerals (ONE-A-DAY MENS HEALTH FORMULA PO) Take 1 tablet by mouth daily.    [provider]  oxyCODONE-acetaminophen (PERCOCET) 10-325 MG tablet Take 1 tablet by mouth every 4 (four) hours as needed for pain. 07/20/16   Jimmye Norman, MD      Allergies    No known allergies    Review of Systems   Review of Systems  Constitutional:  Negative for appetite change and fatigue.  HENT:  Negative for congestion, ear discharge and sinus pressure.   Eyes:  Negative  for discharge.  Respiratory:  Negative for cough.   Cardiovascular:  Negative for chest pain.  Gastrointestinal:  Negative for abdominal pain and diarrhea.  Genitourinary:  Negative for frequency and hematuria.  Musculoskeletal:  Negative for back pain.  Skin:  Negative for rash.  Neurological:  Negative for seizures and headaches.  Psychiatric/Behavioral:  Negative for hallucinations.    Physical Exam Updated Vital Signs BP (!) 198/114   Pulse 82   Temp 98.1 F (36.7 C) (Oral)   Resp 16   SpO2 100%  Physical Exam Vitals and nursing note reviewed.  Constitutional:      Appearance: He is well-developed.  HENT:     Head: Normocephalic.     Nose: Nose normal.  Eyes:     General: No scleral icterus.    Conjunctiva/sclera: Conjunctivae normal.  Neck:     Thyroid: No thyromegaly.  Cardiovascular:     Rate and Rhythm: Normal rate and regular rhythm.     Heart sounds: No murmur heard.   No friction rub. No gallop.  Pulmonary:     Breath sounds: No stridor. No wheezing or rales.  Chest:     Chest wall: No tenderness.  Abdominal:     General: There is no distension.     Tenderness: There is no abdominal tenderness. There is no rebound.  Musculoskeletal:        General: Normal range of motion.     Cervical back: Neck  supple.  Lymphadenopathy:     Cervical: No cervical adenopathy.  Skin:    Findings: No erythema or rash.  Neurological:     Mental Status: He is alert and oriented to person, place, and time.     Motor: No abnormal muscle tone.     Coordination: Coordination normal.  Psychiatric:        Behavior: Behavior normal.    ED Results / Procedures / Treatments   Labs (all labs ordered are listed, but only abnormal results are displayed) Labs Reviewed  CBC WITH DIFFERENTIAL/PLATELET - Abnormal; Notable for the following components:      Result Value   WBC 12.6 (*)    RBC 3.92 (*)    Hemoglobin 12.7 (*)    HCT 37.4 (*)    Neutro Abs 8.9 (*)    Monocytes  Absolute 1.3 (*)    All other components within normal limits  BASIC METABOLIC PANEL - Abnormal; Notable for the following components:   Creatinine, Ser 1.36 (*)    All other components within normal limits    EKG EKG Interpretation  Date/Time:  Wednesday January 13 2022 15:46:25 EDT Ventricular Rate:  93 PR Interval:  151 QRS Duration: 87 QT Interval:  385 QTC Calculation: 479 R Axis:   -2 Text Interpretation: Sinus rhythm Probable left atrial enlargement Anterior infarct, old Confirmed by Bethann BerkshireZammit, Indria Bishara [phone]([id number]) on 01/13/2022 8:20:37 PM  Radiology DG Chest 2 View  Result Date: 01/13/2022 CLINICAL DATA:  Weakness, high blood pressure. EXAM: CHEST - 2 VIEW COMPARISON:  07/13/2016. FINDINGS: The heart size and mediastinal contours are within normal limits. There is atherosclerotic calcification of the aortic. No consolidation, effusion, or pneumothorax. No acute osseous abnormality. IMPRESSION: No active cardiopulmonary disease. Electronically Signed   By: Thornell SartoriusLaura  Taylor M.D.   On: 01/13/2022 20:01    Procedures Procedures    Medications Ordered in ED Medications  labetalol (NORMODYNE) injection 20 mg (20 mg Intravenous Given 01/13/22 1939)    ED Course/ Medical Decision Making/ A&P                           Medical Decision Making Amount and/or Complexity of Data Reviewed Radiology: ordered.  Risk Prescription drug management.  This patient presents to the ED for concern of hypertension, this involves an extensive number of treatment options, and is a complaint that carries with it a high risk of complications and morbidity.  The differential diagnosis includes essential hypertension   Co morbidities that complicate the patient evaluation  History of hypertension   Additional history obtained:  Additional history obtained from patient External records from outside source obtained and reviewed including hospital record   Lab Tests:  I Ordered, and personally  interpreted labs.  The pertinent results include: CBC and chemistry that showed a mildly elevated creatinine   Imaging Studies ordered:  I ordered imaging studies including chest x-ray unremarkable I independently visualized and interpreted imaging which showed unremarkable I agree with the radiologist interpretation   Cardiac Monitoring: / EKG:  The patient was maintained on a cardiac monitor.  I personally viewed and interpreted the cardiac monitored which showed an underlying rhythm of: Normal sinus rhythm   Consultations Obtained:  No consult Problem List / ED Course / Critical interventions / Medication management  Hypertension and elevated creatinine I ordered medication including labetalol for blood pressure Reevaluation of the patient after these medicines showed that the patient improved I have reviewed the patients  home medicines and have made adjustments as needed   Social Determinants of Health:  None   Test / Admission - Considered:  No other test considered no  Patient with poorly controlled blood pressure and mildly elevated creatinine and poor R wave progression on EKG.  He will be discharged home with labetalol and referred to cardiology and needs to follow-up with primary care again        Final Clinical Impression(s) / ED Diagnoses Final diagnoses:  Hypertension, unspecified type    Rx / DC Orders ED Discharge Orders          Ordered    labetalol (NORMODYNE) 100 MG tablet  2 times daily        01/13/22 2029              Bethann Berkshire, MD 01/13/22 2036

## 2022-01-13 NOTE — Discharge Instructions (Signed)
Start taking your blood pressure medicine tomorrow.  Do not take any drugs or medicines that are not prescribed to you.  This may affect your blood pressure severely.  Follow-up with a cardiologist in the next couple weeks and follow-up with your family doctor in the next week

## 2022-01-13 NOTE — ED Triage Notes (Signed)
Pt reports going to PCP today for diverticulitis symptoms and was told his BP was high. Pt denies headache, chest pain, dizziness, blurred vision, and SHOB.

## 2023-10-17 IMAGING — CR DG CHEST 2V
2 series · 2 of 2 positions shown · non-contrast
Comparison: 07/13/2016.

CLINICAL DATA: Weakness, high blood pressure.

EXAM:
CHEST - 2 VIEW

[w chest pa]
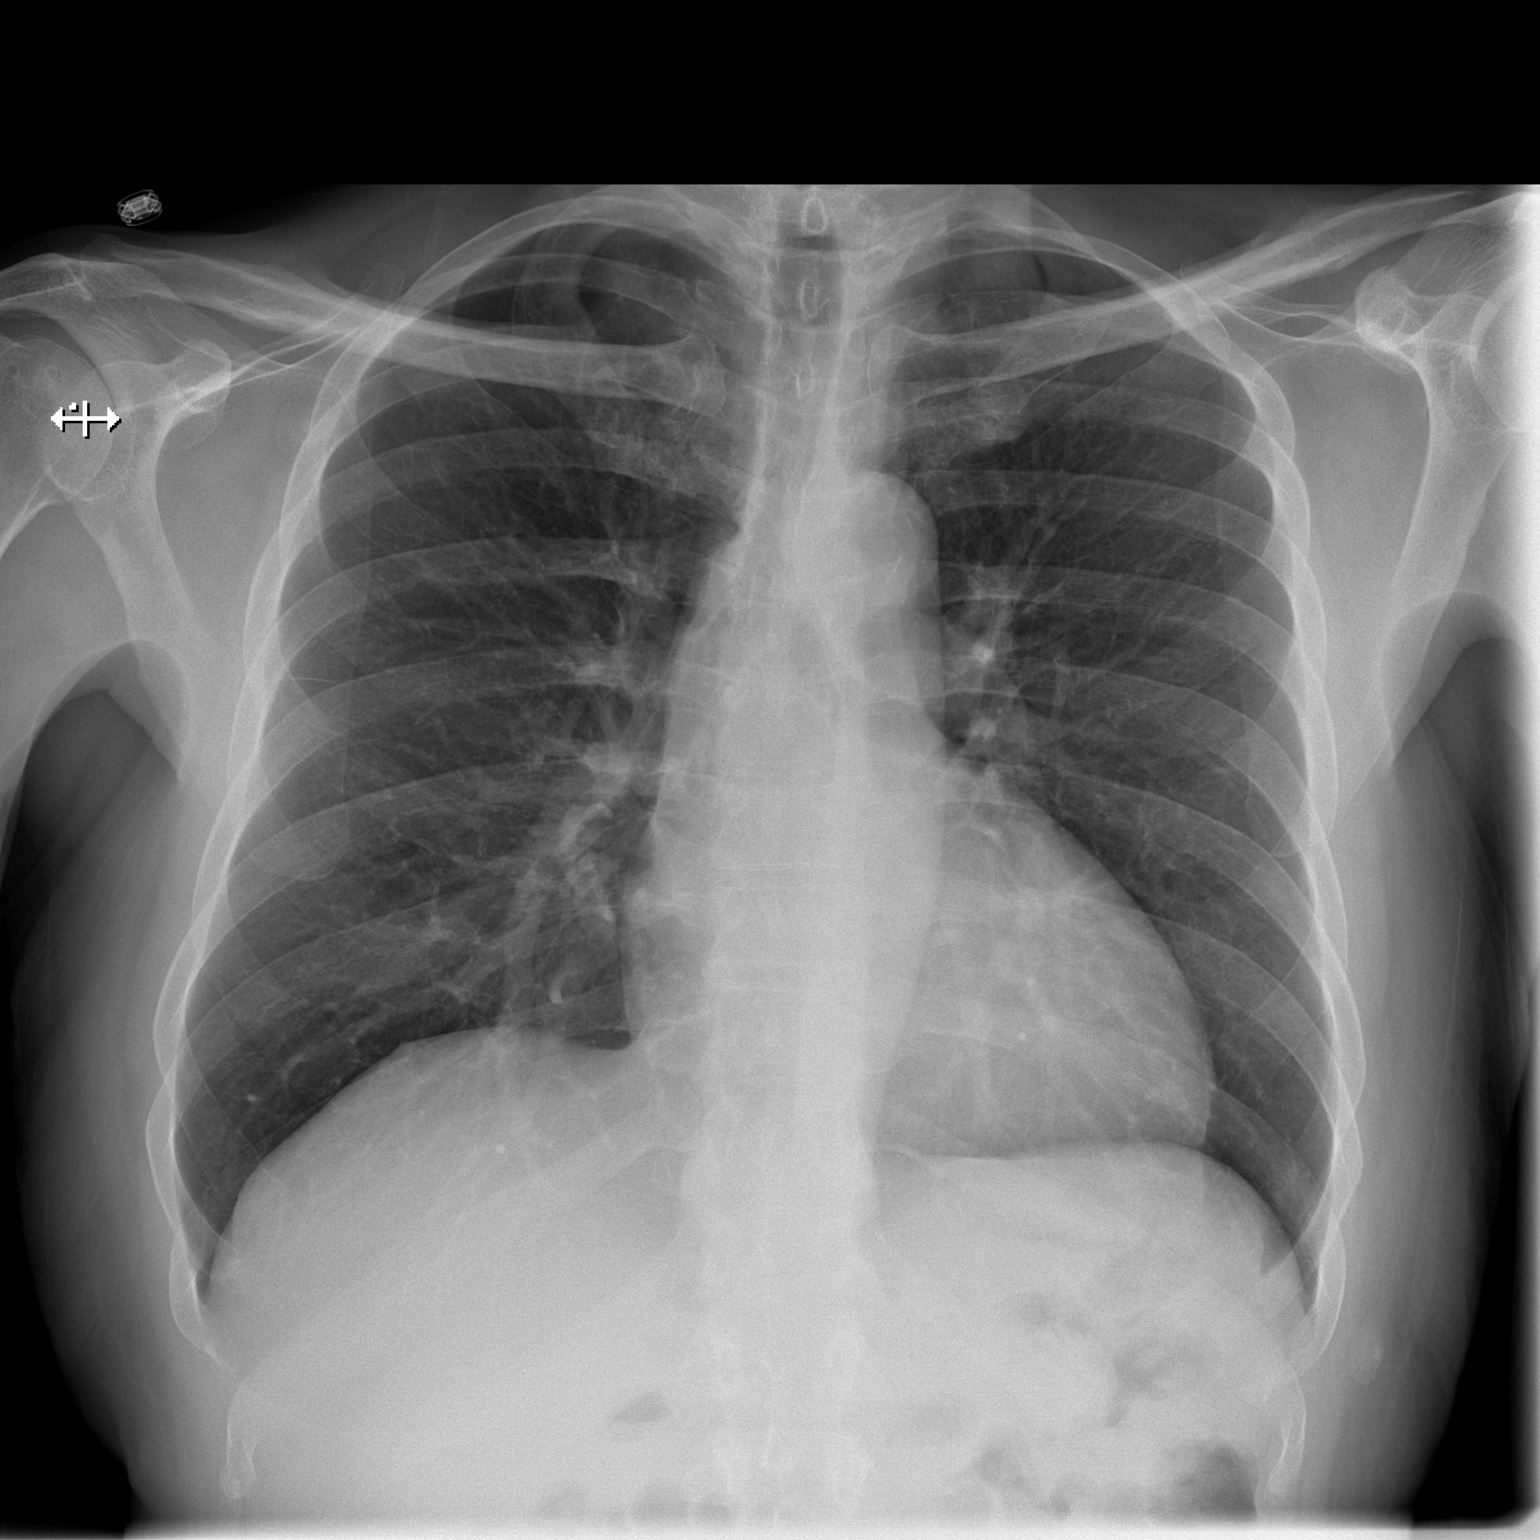

[w chest lat]
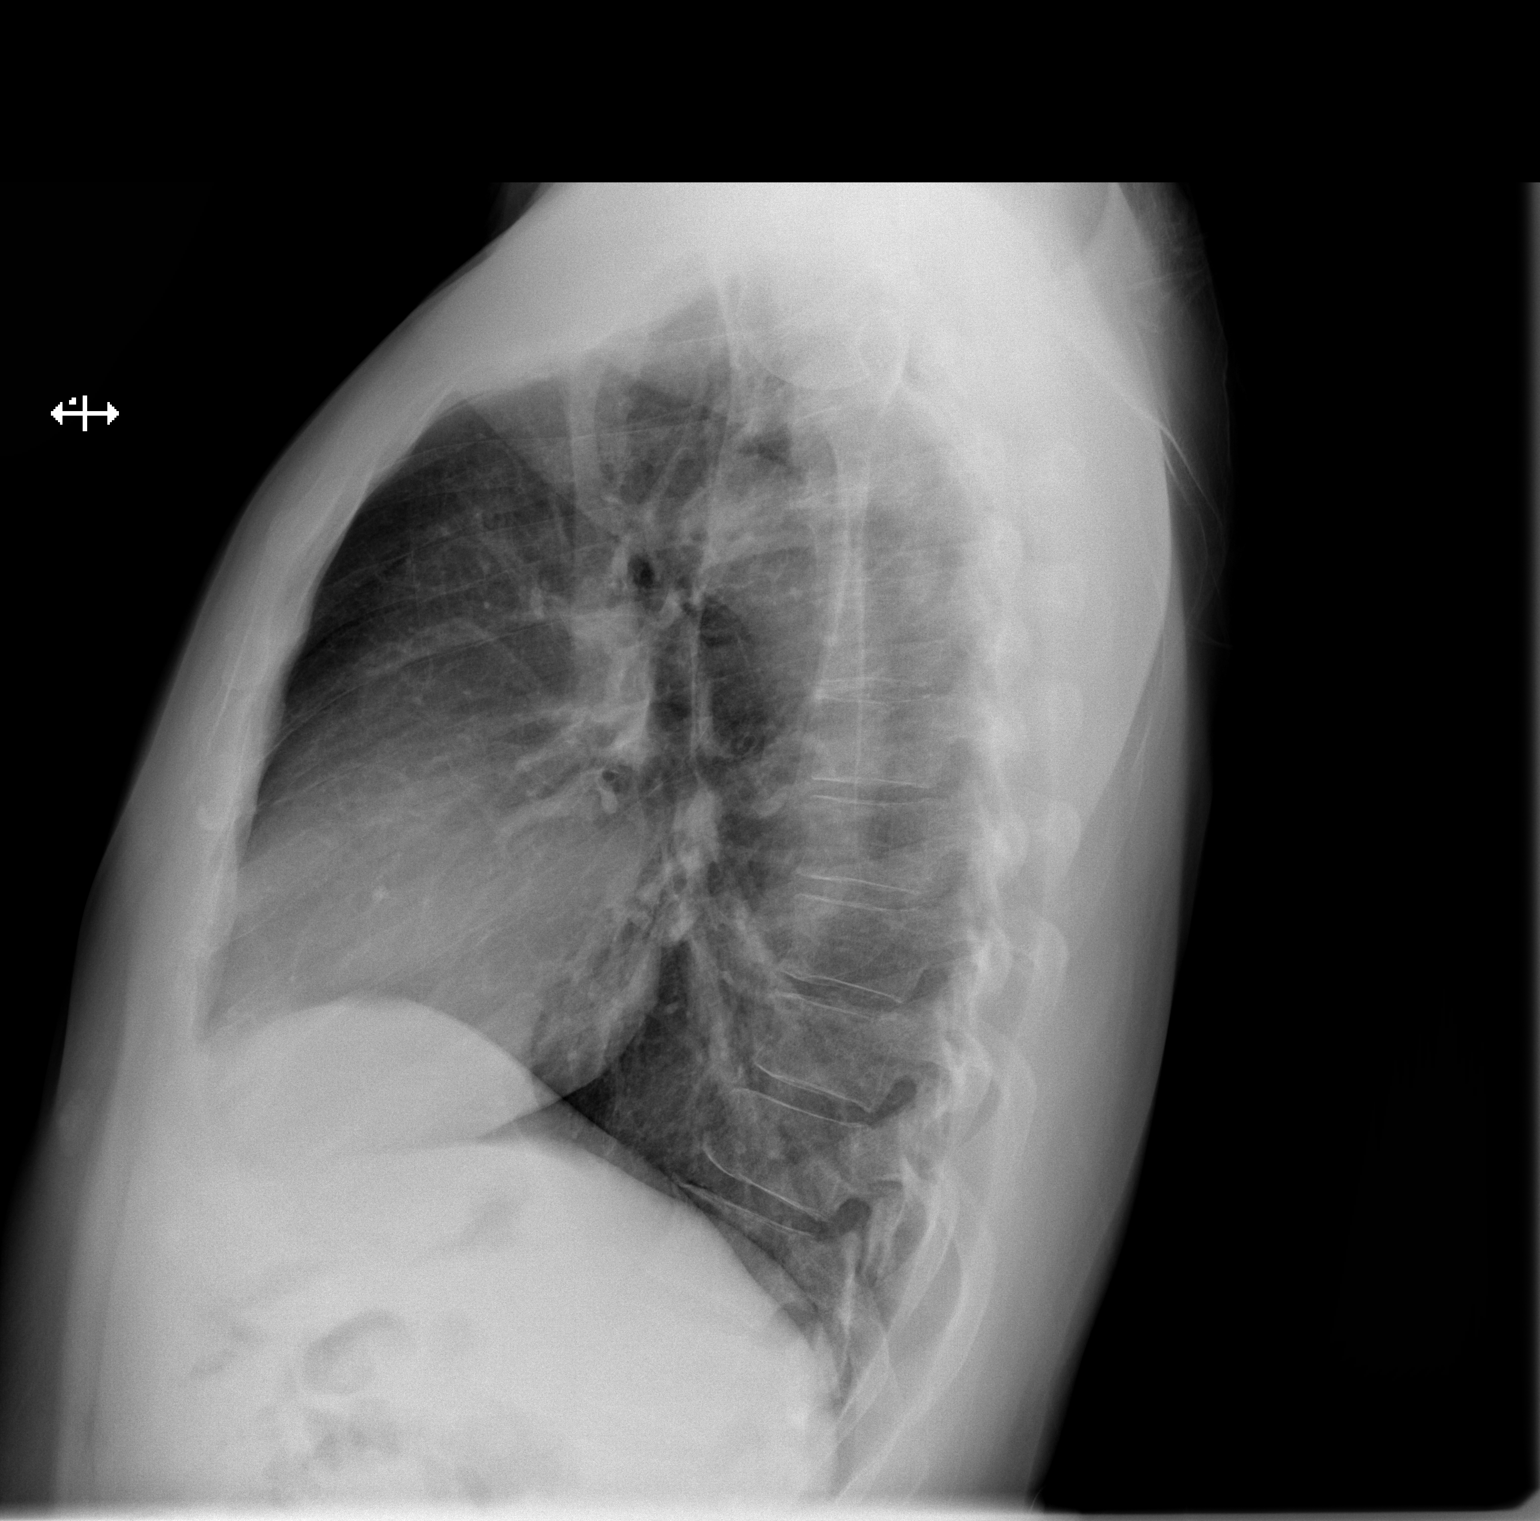

[2 of 2 positions shown; findings below may reference images not displayed]

FINDINGS: The heart size and mediastinal contours are within normal limits.
There is atherosclerotic calcification of the aortic. No
consolidation, effusion, or pneumothorax. No acute osseous
abnormality.
IMPRESSION: No active cardiopulmonary disease.
